# Patient Record
Sex: Female | Born: 1980 | Race: Black or African American | Hispanic: No | Marital: Single | Smoking: Current every day smoker
Health system: Southern US, Community
[De-identification: ages and names within clinical notes are randomized; demographics above are authoritative.]

## PROBLEM LIST (undated history)

## (undated) HISTORY — PX: WISDOM TOOTH EXTRACTION: SHX21

## (undated) HISTORY — PX: OTHER SURGICAL HISTORY: SHX169

---
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
## 2002-07-11 ENCOUNTER — Inpatient Hospital Stay (HOSPITAL_COMMUNITY): Admission: RE | Admit: 2002-07-11 | Discharge: 2002-07-11 | Payer: Self-pay | Admitting: Obstetrics

## 2002-07-11 ENCOUNTER — Encounter: Payer: Self-pay | Admitting: Obstetrics

## 2002-07-18 ENCOUNTER — Ambulatory Visit (HOSPITAL_COMMUNITY): Admission: RE | Admit: 2002-07-18 | Discharge: 2002-07-18 | Payer: Self-pay | Admitting: Obstetrics

## 2002-07-18 ENCOUNTER — Encounter: Payer: Self-pay | Admitting: Obstetrics

## 2002-07-25 ENCOUNTER — Ambulatory Visit (HOSPITAL_COMMUNITY): Admission: RE | Admit: 2002-07-25 | Discharge: 2002-07-25 | Payer: Self-pay | Admitting: Obstetrics

## 2002-07-25 ENCOUNTER — Encounter: Payer: Self-pay | Admitting: Obstetrics

## 2002-08-02 ENCOUNTER — Ambulatory Visit (HOSPITAL_COMMUNITY): Admission: RE | Admit: 2002-08-02 | Discharge: 2002-08-02 | Payer: Self-pay | Admitting: Obstetrics

## 2002-08-02 ENCOUNTER — Encounter: Payer: Self-pay | Admitting: Obstetrics

## 2002-08-08 ENCOUNTER — Encounter: Payer: Self-pay | Admitting: Obstetrics

## 2002-08-08 ENCOUNTER — Ambulatory Visit (HOSPITAL_COMMUNITY): Admission: RE | Admit: 2002-08-08 | Discharge: 2002-08-08 | Payer: Self-pay | Admitting: Obstetrics

## 2002-08-15 ENCOUNTER — Ambulatory Visit (HOSPITAL_COMMUNITY): Admission: RE | Admit: 2002-08-15 | Discharge: 2002-08-15 | Payer: Self-pay | Admitting: Obstetrics

## 2002-08-15 ENCOUNTER — Encounter: Payer: Self-pay | Admitting: Obstetrics

## 2002-08-22 ENCOUNTER — Encounter: Payer: Self-pay | Admitting: Obstetrics

## 2002-08-22 ENCOUNTER — Ambulatory Visit (HOSPITAL_COMMUNITY): Admission: RE | Admit: 2002-08-22 | Discharge: 2002-08-22 | Payer: Self-pay | Admitting: Obstetrics

## 2002-08-23 ENCOUNTER — Encounter: Admission: RE | Admit: 2002-08-23 | Discharge: 2002-08-23 | Payer: Self-pay | Admitting: Obstetrics

## 2002-08-29 ENCOUNTER — Encounter: Payer: Self-pay | Admitting: Obstetrics

## 2002-08-29 ENCOUNTER — Encounter: Admission: RE | Admit: 2002-08-29 | Discharge: 2002-08-29 | Payer: Self-pay | Admitting: Obstetrics

## 2002-08-29 ENCOUNTER — Ambulatory Visit (HOSPITAL_COMMUNITY): Admission: RE | Admit: 2002-08-29 | Discharge: 2002-08-29 | Payer: Self-pay | Admitting: Obstetrics

## 2002-09-05 ENCOUNTER — Encounter: Admission: RE | Admit: 2002-09-05 | Discharge: 2002-09-05 | Payer: Self-pay | Admitting: Obstetrics

## 2002-09-10 ENCOUNTER — Inpatient Hospital Stay (HOSPITAL_COMMUNITY): Admission: AD | Admit: 2002-09-10 | Discharge: 2002-09-10 | Payer: Self-pay | Admitting: Obstetrics

## 2002-09-12 ENCOUNTER — Ambulatory Visit (HOSPITAL_COMMUNITY): Admission: RE | Admit: 2002-09-12 | Discharge: 2002-09-12 | Payer: Self-pay | Admitting: Obstetrics

## 2002-09-12 ENCOUNTER — Encounter: Payer: Self-pay | Admitting: Obstetrics

## 2002-09-12 ENCOUNTER — Encounter: Admission: RE | Admit: 2002-09-12 | Discharge: 2002-09-12 | Payer: Self-pay | Admitting: Family Medicine

## 2002-09-13 ENCOUNTER — Inpatient Hospital Stay (HOSPITAL_COMMUNITY): Admission: AD | Admit: 2002-09-13 | Discharge: 2002-09-16 | Payer: Self-pay | Admitting: Obstetrics

## 2003-04-18 ENCOUNTER — Other Ambulatory Visit: Admission: RE | Admit: 2003-04-18 | Discharge: 2003-04-18 | Payer: Self-pay | Admitting: Family Medicine

## 2004-04-29 ENCOUNTER — Other Ambulatory Visit: Admission: RE | Admit: 2004-04-29 | Discharge: 2004-04-29 | Payer: Self-pay | Admitting: Family Medicine

## 2012-09-07 ENCOUNTER — Encounter (HOSPITAL_COMMUNITY): Payer: Self-pay | Admitting: *Deleted

## 2012-09-07 ENCOUNTER — Other Ambulatory Visit: Payer: Self-pay | Admitting: Obstetrics & Gynecology

## 2012-09-08 ENCOUNTER — Encounter (HOSPITAL_COMMUNITY): Payer: Self-pay | Admitting: Pharmacy Technician

## 2012-09-09 MED ORDER — DOXYCYCLINE HYCLATE 100 MG IV SOLR
100.0000 mg | Freq: Two times a day (BID) | INTRAVENOUS | Status: DC
  Filled 2012-09-09 (×4): qty 100

## 2012-09-09 MED ORDER — DOXYCYCLINE HYCLATE 100 MG IV SOLR
100.0000 mg | Freq: Once | INTRAVENOUS | Status: AC
  Administered 2012-09-10: 100 mg via INTRAVENOUS
  Filled 2012-09-09: qty 100

## 2012-09-10 ENCOUNTER — Encounter (HOSPITAL_COMMUNITY): Payer: Self-pay | Admitting: *Deleted

## 2012-09-10 ENCOUNTER — Encounter (HOSPITAL_COMMUNITY): Payer: Self-pay | Admitting: Anesthesiology

## 2012-09-10 ENCOUNTER — Ambulatory Visit (HOSPITAL_COMMUNITY): Payer: Managed Care, Other (non HMO) | Admitting: Anesthesiology

## 2012-09-10 ENCOUNTER — Ambulatory Visit (HOSPITAL_COMMUNITY)
Admission: RE | Admit: 2012-09-10 | Discharge: 2012-09-10 | Disposition: A | Discharge status: Home / Self Care | Payer: Managed Care, Other (non HMO) | Source: Ambulatory Visit | Attending: Obstetrics & Gynecology | Admitting: Obstetrics & Gynecology

## 2012-09-10 ENCOUNTER — Encounter (HOSPITAL_COMMUNITY)
Admission: RE | Disposition: A | Discharge status: Home / Self Care | Payer: Self-pay | Source: Ambulatory Visit | Attending: Obstetrics & Gynecology

## 2012-09-10 DIAGNOSIS — IMO0001 Reserved for inherently not codable concepts without codable children: Secondary | ICD-10-CM

## 2012-09-10 DIAGNOSIS — Z640 Problems related to unwanted pregnancy: Secondary | ICD-10-CM

## 2012-09-10 DIAGNOSIS — O039 Complete or unspecified spontaneous abortion without complication: Secondary | ICD-10-CM | POA: Insufficient documentation

## 2012-09-10 HISTORY — PX: DILATION AND EVACUATION: SHX1459

## 2012-09-10 LAB — CBC
HCT: 38.2 % (ref 36.0–46.0)
MCV: 84 fL (ref 78.0–100.0)
RDW: 13.9 % (ref 11.5–15.5)
WBC: 8 10*3/uL (ref 4.0–10.5)

## 2012-09-10 SURGERY — DILATION AND EVACUATION, UTERUS
Anesthesia: Monitor Anesthesia Care | Site: Vagina | Wound class: Clean Contaminated

## 2012-09-10 MED ORDER — DEXAMETHASONE SODIUM PHOSPHATE 10 MG/ML IJ SOLN
INTRAMUSCULAR | Status: AC
  Filled 2012-09-10: qty 1

## 2012-09-10 MED ORDER — ONDANSETRON HCL 4 MG/2ML IJ SOLN
INTRAMUSCULAR | Status: DC | PRN
  Administered 2012-09-10: 4 mg via INTRAVENOUS

## 2012-09-10 MED ORDER — PROPOFOL 10 MG/ML IV BOLUS
INTRAVENOUS | Status: DC | PRN
  Administered 2012-09-10 (×2): 40 mg via INTRAVENOUS
  Administered 2012-09-10: 30 mg via INTRAVENOUS
  Administered 2012-09-10: 40 mg via INTRAVENOUS
  Administered 2012-09-10 (×2): 30 mg via INTRAVENOUS

## 2012-09-10 MED ORDER — LIDOCAINE HCL (CARDIAC) 20 MG/ML IV SOLN
INTRAVENOUS | Status: AC
  Filled 2012-09-10: qty 5

## 2012-09-10 MED ORDER — CHLOROPROCAINE HCL 1 % IJ SOLN
INTRAMUSCULAR | Status: DC | PRN
  Administered 2012-09-10: 30 mL

## 2012-09-10 MED ORDER — PROPOFOL 10 MG/ML IV EMUL
INTRAVENOUS | Status: AC
  Filled 2012-09-10: qty 40

## 2012-09-10 MED ORDER — MIDAZOLAM HCL 2 MG/2ML IJ SOLN
INTRAMUSCULAR | Status: AC
  Filled 2012-09-10: qty 2

## 2012-09-10 MED ORDER — IBUPROFEN 200 MG PO TABS: 600.0000 mg | ORAL_TABLET | Freq: Three times a day (TID) | ORAL | Status: DC | PRN

## 2012-09-10 MED ORDER — ONDANSETRON HCL 4 MG/2ML IJ SOLN
INTRAMUSCULAR | Status: AC
  Filled 2012-09-10: qty 2

## 2012-09-10 MED ORDER — FENTANYL CITRATE 0.05 MG/ML IJ SOLN
INTRAMUSCULAR | Status: DC | PRN
  Administered 2012-09-10: 100 ug via INTRAVENOUS
  Administered 2012-09-10: 50 ug via INTRAVENOUS

## 2012-09-10 MED ORDER — FENTANYL CITRATE 0.05 MG/ML IJ SOLN
INTRAMUSCULAR | Status: AC
  Filled 2012-09-10: qty 5

## 2012-09-10 MED ORDER — MIDAZOLAM HCL 5 MG/5ML IJ SOLN
INTRAMUSCULAR | Status: DC | PRN
  Administered 2012-09-10: 2 mg via INTRAVENOUS

## 2012-09-10 MED ORDER — LACTATED RINGERS IV SOLN
INTRAVENOUS | Status: DC
  Administered 2012-09-10: 15:00:00 via INTRAVENOUS

## 2012-09-10 MED ORDER — KETOROLAC TROMETHAMINE 30 MG/ML IJ SOLN: 15.0000 mg | Freq: Once | INTRAMUSCULAR | Status: DC | PRN

## 2012-09-10 MED ORDER — MEPERIDINE HCL 25 MG/ML IJ SOLN: 6.2500 mg | INTRAMUSCULAR | Status: DC | PRN

## 2012-09-10 MED ORDER — KETOROLAC TROMETHAMINE 30 MG/ML IJ SOLN
INTRAMUSCULAR | Status: DC | PRN
  Administered 2012-09-10: 30 mg via INTRAVENOUS

## 2012-09-10 MED ORDER — CHLOROPROCAINE HCL 1 % IJ SOLN
INTRAMUSCULAR | Status: AC
  Filled 2012-09-10: qty 30

## 2012-09-10 MED ORDER — MIDAZOLAM HCL 2 MG/2ML IJ SOLN: 0.5000 mg | Freq: Once | INTRAMUSCULAR | Status: DC | PRN

## 2012-09-10 MED ORDER — PROMETHAZINE HCL 25 MG/ML IJ SOLN: 6.2500 mg | INTRAMUSCULAR | Status: DC | PRN

## 2012-09-10 MED ORDER — FENTANYL CITRATE 0.05 MG/ML IJ SOLN: 25.0000 ug | INTRAMUSCULAR | Status: DC | PRN

## 2012-09-10 MED ORDER — LIDOCAINE HCL (CARDIAC) 20 MG/ML IV SOLN
INTRAVENOUS | Status: DC | PRN
  Administered 2012-09-10: 40 mg via INTRAVENOUS

## 2012-09-10 SURGICAL SUPPLY — 21 items
CATH ROBINSON RED A/P 16FR (CATHETERS) ×2 IMPLANT
CLOTH BEACON ORANGE TIMEOUT ST (SAFETY) ×2 IMPLANT
DECANTER SPIKE VIAL GLASS SM (MISCELLANEOUS) ×2 IMPLANT
GLOVE BIO SURGEON STRL SZ7 (GLOVE) ×2 IMPLANT
GLOVE BIOGEL PI IND STRL 7.0 (GLOVE) ×1 IMPLANT
GLOVE BIOGEL PI INDICATOR 7.0 (GLOVE) ×2
GOWN STRL REIN XL XLG (GOWN DISPOSABLE) ×4 IMPLANT
KIT BERKELEY 1ST TRIMESTER 3/8 (MISCELLANEOUS) ×2 IMPLANT
NDL SPNL 22GX3.5 QUINCKE BK (NEEDLE) ×1 IMPLANT
NEEDLE SPNL 22GX3.5 QUINCKE BK (NEEDLE) ×2 IMPLANT
NS IRRIG 1000ML POUR BTL (IV SOLUTION) ×2 IMPLANT
PACK VAGINAL MINOR WOMEN LF (CUSTOM PROCEDURE TRAY) ×2 IMPLANT
PAD OB MATERNITY 4.3X12.25 (PERSONAL CARE ITEMS) ×2 IMPLANT
PAD PREP 24X48 CUFFED NSTRL (MISCELLANEOUS) ×2 IMPLANT
SET BERKELEY SUCTION TUBING (SUCTIONS) ×2 IMPLANT
SYR CONTROL 10ML LL (SYRINGE) ×2 IMPLANT
TOWEL OR 17X24 6PK STRL BLUE (TOWEL DISPOSABLE) ×4 IMPLANT
VACURETTE 10 RIGID CVD (CANNULA) IMPLANT
VACURETTE 7MM CVD STRL WRAP (CANNULA) ×1 IMPLANT
VACURETTE 8 RIGID CVD (CANNULA) IMPLANT
VACURETTE 9 RIGID CVD (CANNULA) IMPLANT

## 2012-09-10 NOTE — Anesthesia Preprocedure Evaluation (Signed)
Anesthesia Evaluation  Patient identified by MRN, date of birth, ID band Patient awake    Reviewed: Allergy & Precautions, H&P , Patient's Chart, lab work & pertinent test results, reviewed documented beta blocker date and time   History of Anesthesia Complications Negative for: history of anesthetic complications  Airway Mallampati: II TM Distance: >3 FB Neck ROM: full    Dental no notable dental hx.    Pulmonary neg pulmonary ROS,  breath sounds clear to auscultation  Pulmonary exam normal       Cardiovascular Exercise Tolerance: Good negative cardio ROS  Rhythm:regular Rate:Normal     Neuro/Psych negative neurological ROS  negative psych ROS   GI/Hepatic negative GI ROS, Neg liver ROS,   Endo/Other  negative endocrine ROS  Renal/GU negative Renal ROS     Musculoskeletal   Abdominal   Peds  Hematology negative hematology ROS (+)   Anesthesia Other Findings   Reproductive/Obstetrics negative OB ROS                           Anesthesia Physical Anesthesia Plan  ASA: II  Anesthesia Plan: MAC   Post-op Pain Management:    Induction:   Airway Management Planned:   Additional Equipment:   Intra-op Plan:   Post-operative Plan:   Informed Consent: I have reviewed the patients History and Physical, chart, labs and discussed the procedure including the risks, benefits and alternatives for the proposed anesthesia with the patient or authorized representative who has indicated his/her understanding and acceptance.   Dental Advisory Given  Plan Discussed with: CRNA, Surgeon and Anesthesiologist  Anesthesia Plan Comments:         Anesthesia Quick Evaluation

## 2012-09-10 NOTE — Anesthesia Postprocedure Evaluation (Signed)
Anesthesia Post Note  Patient: Brianna Schwartz  Procedure(s) Performed: Procedure(s) (LRB): DILATATION AND EVACUATION (N/A)  Anesthesia type: MAC  Patient location: PACU  Post pain: Pain level controlled  Post assessment: Post-op Vital signs reviewed  Last Vitals:  Filed Vitals:   09/10/12 1715  BP: 114/73  Pulse: 73  Temp:   Resp: 15    Post vital signs: Reviewed  Level of consciousness: sedated  Complications: No apparent anesthesia complications

## 2012-09-10 NOTE — Transfer of Care (Signed)
Immediate Anesthesia Transfer of Care Note  Patient: Brianna Schwartz  Procedure(s) Performed: Procedure(s): DILATATION AND EVACUATION (N/A)  Patient Location: PACU  Anesthesia Type:MAC  Level of Consciousness: awake, alert , oriented and patient cooperative  Airway & Oxygen Therapy: Patient Spontanous Breathing  Post-op Assessment: Report given to PACU RN and Post -op Vital signs reviewed and stable  Post vital signs: Reviewed and stable  Complications: No apparent anesthesia complications

## 2012-09-10 NOTE — H&P (Signed)
Brianna Schwartz is an 32 y.o. female G2P1002, with unplanned and unwanted pregnancy at 7.6 wks, here for pregnancy termination by choice and is aware of her options to continuing pregnancy, BUFA and pregnancy termination after adequate counseling 4 days back.  No bleeding or pain. Office sono c/w IUP at 7.6 wks.  Menses regular Prior pregnancy - Twins c/sectiobn x 1 BC none, desires sterilization Remote abn Pap hx.  NO breast complaints.   Patient's last menstrual period was 07/20/2012.   History reviewed. No pertinent past medical history.  Past Surgical History  Procedure Laterality Date  . Removed boil      buttocks  . Cesarean section      x 1  . Wisdom tooth extraction     History reviewed. No pertinent family history.  Social History:  reports that she has been smoking Cigarettes.  She has a 7.5 pack-year smoking history. She has never used smokeless tobacco. She reports that  drinks alcohol. She reports that she does not use illicit drugs.  Allergies: No Known Allergies  No prescriptions prior to admission   Review of Systems  Constitutional: Negative for fever.  Respiratory: Negative for cough.   Cardiovascular: Negative for chest pain.  Gastrointestinal: Negative for heartburn and abdominal pain.  Genitourinary: Negative for dysuria.  Neurological: Negative for headaches.    Physical Exam Blood pressure 127/76, pulse 91, temperature 98.6 F (37 C), temperature source Oral, resp. rate 16, height 5' 8.5" (1.74 m), weight 215 lb (97.523 kg), last menstrual period 07/20/2012, SpO2 100.00%.  A&O x 3, no acute distress. Pleasant HEENT neg, no thyromegaly Lungs CTA bilat CV RRR, S1S2 normal Abdo soft, non tender, non acute Extr no edema/ tenderness Pelvic Uterus 7-8 wks. No adnexal masses   Results for orders placed during the hospital encounter of 09/10/12 (from the past 24 hour(s))  ABO/RH     Status: None   Collection Time    09/10/12  3:00 PM      Result  Value Range   ABO/RH(D) AB POS    CBC     Status: None   Collection Time    09/10/12  3:00 PM      Result Value Range   WBC 8.0  4.0 - 10.5 K/uL   RBC 4.55  3.87 - 5.11 MIL/uL   Hemoglobin 13.1  12.0 - 15.0 g/dL   HCT 21.3  08.6 - 57.8 %   MCV 84.0  78.0 - 100.0 fL   MCH 28.8  26.0 - 34.0 pg   MCHC 34.3  30.0 - 36.0 g/dL   RDW 46.9  62.9 - 52.8 %   Platelets 250  150 - 400 K/uL    No results found.  Assessment/Plan: 32 yo, with 7.6 wks IUP, undesired pregnancy, here for pregnancy termination.  AB(+)  Risks/complications of surgery reviewed incl infection, bleeding, damage to internal organs including bladder, bowels, ureters, blood vessels, other risks from anesthesia, VTE and delayed complications of any surgery, complications in future surgery reviewed.  Emanual Lamountain R 09/10/2012, 4:03 PM

## 2012-09-10 NOTE — Op Note (Signed)
Preoperative diagnosis: Unwanted, Unplanned pregnancy at 7.6 wks.  Postop diagnosis: as above.  Procedure: Suction D&E Anesthesia General MAC and 30 cc Paracervical block  Surgeon: Shea Evans, MD Assistant: none  IV fluids : LR 600 cc Estimated blood loss: 30 cc Urine output:  straight catheter preop Complications none Condition stable Disposition PACU  Specimen: products of conception sent to pathology.  Procedure  Indication: Decision was made to proceed with suction D&E. Risks/ complications of surgery including infection, bleeding, damage to internal organs including uterine perforation, Asherman syndrome were reviewed. She voiced understanding and gave informed written consent.  Patient was brought to the operating room with IV running. She received preop 100 mg Doxycycline. She underwent general endotracheal anesthesia without complications. She was given dorsolithotomy position. Parts were prepped and draped in standard fashion.  Bimanual exam revealed uterus to be anteverted and 8 week size.  Speculum was placed and cervix was grasped with single-tooth tenaculum.  Cervical os was pinpoint and dilated to 21 [place] dilator. A # 7 suction curette was introduced in the uterine cavity and suction evacuation of products of conception was done in multiple step wise fashion. Gentle sharp curettage was performed. Suction cannula was reintroduced.  The bleeding was controlled well and uterus was firm on exam.  At this point the procedure was complete.  All counts are correct x2. Specimen products of conception. No complications. Patient was made supine dorsal anesthesia and brought to the recovery room in stable condition.  Patient will be discharged home today. Dc follow up in 2 weeks in office. Warning signs of infection and excessive bleeding reviewed.  V.Zavia Pullen, MD.

## 2012-09-11 ENCOUNTER — Encounter (HOSPITAL_COMMUNITY): Payer: Self-pay | Admitting: Obstetrics & Gynecology

## 2013-12-30 ENCOUNTER — Encounter (HOSPITAL_COMMUNITY): Payer: Self-pay | Admitting: Obstetrics & Gynecology

## 2015-07-24 DIAGNOSIS — R5383 Other fatigue: Secondary | ICD-10-CM | POA: Diagnosis not present

## 2015-07-24 DIAGNOSIS — R635 Abnormal weight gain: Secondary | ICD-10-CM | POA: Diagnosis not present

## 2015-10-30 DIAGNOSIS — Z6835 Body mass index (BMI) 35.0-35.9, adult: Secondary | ICD-10-CM | POA: Diagnosis not present

## 2015-10-30 DIAGNOSIS — R5383 Other fatigue: Secondary | ICD-10-CM | POA: Diagnosis not present

## 2015-11-26 DIAGNOSIS — N760 Acute vaginitis: Secondary | ICD-10-CM | POA: Diagnosis not present

## 2015-12-09 DIAGNOSIS — R5383 Other fatigue: Secondary | ICD-10-CM | POA: Diagnosis not present

## 2015-12-09 DIAGNOSIS — Z6835 Body mass index (BMI) 35.0-35.9, adult: Secondary | ICD-10-CM | POA: Diagnosis not present

## 2016-01-05 DIAGNOSIS — D649 Anemia, unspecified: Secondary | ICD-10-CM | POA: Diagnosis not present

## 2016-01-05 DIAGNOSIS — R7301 Impaired fasting glucose: Secondary | ICD-10-CM | POA: Diagnosis not present

## 2016-01-05 DIAGNOSIS — J301 Allergic rhinitis due to pollen: Secondary | ICD-10-CM | POA: Diagnosis not present

## 2016-01-05 DIAGNOSIS — E878 Other disorders of electrolyte and fluid balance, not elsewhere classified: Secondary | ICD-10-CM | POA: Diagnosis not present

## 2016-01-05 DIAGNOSIS — J019 Acute sinusitis, unspecified: Secondary | ICD-10-CM | POA: Diagnosis not present

## 2016-01-05 DIAGNOSIS — E569 Vitamin deficiency, unspecified: Secondary | ICD-10-CM | POA: Diagnosis not present

## 2016-01-05 DIAGNOSIS — N925 Other specified irregular menstruation: Secondary | ICD-10-CM | POA: Diagnosis not present

## 2016-01-05 DIAGNOSIS — J069 Acute upper respiratory infection, unspecified: Secondary | ICD-10-CM | POA: Diagnosis not present

## 2016-01-05 DIAGNOSIS — Z6835 Body mass index (BMI) 35.0-35.9, adult: Secondary | ICD-10-CM | POA: Diagnosis not present

## 2016-01-09 DIAGNOSIS — H81399 Other peripheral vertigo, unspecified ear: Secondary | ICD-10-CM | POA: Diagnosis not present

## 2016-02-04 DIAGNOSIS — Z0000 Encounter for general adult medical examination without abnormal findings: Secondary | ICD-10-CM | POA: Diagnosis not present

## 2016-02-12 DIAGNOSIS — E559 Vitamin D deficiency, unspecified: Secondary | ICD-10-CM | POA: Diagnosis not present

## 2016-02-12 DIAGNOSIS — M545 Low back pain: Secondary | ICD-10-CM | POA: Diagnosis not present

## 2016-02-12 DIAGNOSIS — Z0000 Encounter for general adult medical examination without abnormal findings: Secondary | ICD-10-CM | POA: Diagnosis not present

## 2016-02-12 DIAGNOSIS — E663 Overweight: Secondary | ICD-10-CM | POA: Diagnosis not present

## 2016-02-12 DIAGNOSIS — H81319 Aural vertigo, unspecified ear: Secondary | ICD-10-CM | POA: Diagnosis not present

## 2016-02-12 DIAGNOSIS — J302 Other seasonal allergic rhinitis: Secondary | ICD-10-CM | POA: Diagnosis not present

## 2016-02-12 DIAGNOSIS — R5383 Other fatigue: Secondary | ICD-10-CM | POA: Diagnosis not present

## 2016-06-16 DIAGNOSIS — R5382 Chronic fatigue, unspecified: Secondary | ICD-10-CM | POA: Diagnosis not present

## 2016-06-16 DIAGNOSIS — J301 Allergic rhinitis due to pollen: Secondary | ICD-10-CM | POA: Diagnosis not present

## 2016-06-16 DIAGNOSIS — E663 Overweight: Secondary | ICD-10-CM | POA: Diagnosis not present

## 2016-06-16 DIAGNOSIS — E559 Vitamin D deficiency, unspecified: Secondary | ICD-10-CM | POA: Diagnosis not present

## 2016-07-18 DIAGNOSIS — D518 Other vitamin B12 deficiency anemias: Secondary | ICD-10-CM | POA: Diagnosis not present

## 2016-07-18 DIAGNOSIS — J301 Allergic rhinitis due to pollen: Secondary | ICD-10-CM | POA: Diagnosis not present

## 2016-07-18 DIAGNOSIS — E559 Vitamin D deficiency, unspecified: Secondary | ICD-10-CM | POA: Diagnosis not present

## 2016-07-18 DIAGNOSIS — R5382 Chronic fatigue, unspecified: Secondary | ICD-10-CM | POA: Diagnosis not present

## 2016-08-19 DIAGNOSIS — R5383 Other fatigue: Secondary | ICD-10-CM | POA: Diagnosis not present

## 2016-08-19 DIAGNOSIS — E559 Vitamin D deficiency, unspecified: Secondary | ICD-10-CM | POA: Diagnosis not present

## 2016-08-19 DIAGNOSIS — D518 Other vitamin B12 deficiency anemias: Secondary | ICD-10-CM | POA: Diagnosis not present

## 2016-08-19 DIAGNOSIS — E663 Overweight: Secondary | ICD-10-CM | POA: Diagnosis not present

## 2016-09-20 DIAGNOSIS — R5383 Other fatigue: Secondary | ICD-10-CM | POA: Diagnosis not present

## 2016-09-20 DIAGNOSIS — E663 Overweight: Secondary | ICD-10-CM | POA: Diagnosis not present

## 2016-09-20 DIAGNOSIS — E559 Vitamin D deficiency, unspecified: Secondary | ICD-10-CM | POA: Diagnosis not present

## 2016-09-20 DIAGNOSIS — D518 Other vitamin B12 deficiency anemias: Secondary | ICD-10-CM | POA: Diagnosis not present

## 2017-02-14 DIAGNOSIS — Z0001 Encounter for general adult medical examination with abnormal findings: Secondary | ICD-10-CM | POA: Diagnosis not present

## 2017-02-14 DIAGNOSIS — Z124 Encounter for screening for malignant neoplasm of cervix: Secondary | ICD-10-CM | POA: Diagnosis not present

## 2017-02-14 DIAGNOSIS — Z1331 Encounter for screening for depression: Secondary | ICD-10-CM | POA: Diagnosis not present

## 2017-02-14 DIAGNOSIS — E669 Obesity, unspecified: Secondary | ICD-10-CM | POA: Diagnosis not present

## 2017-04-19 DIAGNOSIS — M722 Plantar fascial fibromatosis: Secondary | ICD-10-CM | POA: Diagnosis not present

## 2017-04-19 DIAGNOSIS — M71572 Other bursitis, not elsewhere classified, left ankle and foot: Secondary | ICD-10-CM | POA: Diagnosis not present

## 2017-04-19 DIAGNOSIS — M7672 Peroneal tendinitis, left leg: Secondary | ICD-10-CM | POA: Diagnosis not present

## 2017-04-19 DIAGNOSIS — M7731 Calcaneal spur, right foot: Secondary | ICD-10-CM | POA: Diagnosis not present

## 2017-04-19 DIAGNOSIS — M76822 Posterior tibial tendinitis, left leg: Secondary | ICD-10-CM | POA: Diagnosis not present

## 2017-04-19 DIAGNOSIS — M7732 Calcaneal spur, left foot: Secondary | ICD-10-CM | POA: Diagnosis not present

## 2017-04-28 DIAGNOSIS — M71572 Other bursitis, not elsewhere classified, left ankle and foot: Secondary | ICD-10-CM | POA: Diagnosis not present

## 2017-04-28 DIAGNOSIS — M722 Plantar fascial fibromatosis: Secondary | ICD-10-CM | POA: Diagnosis not present

## 2017-05-08 DIAGNOSIS — M722 Plantar fascial fibromatosis: Secondary | ICD-10-CM | POA: Diagnosis not present

## 2017-05-08 DIAGNOSIS — B351 Tinea unguium: Secondary | ICD-10-CM | POA: Diagnosis not present

## 2017-05-08 DIAGNOSIS — M71572 Other bursitis, not elsewhere classified, left ankle and foot: Secondary | ICD-10-CM | POA: Diagnosis not present

## 2017-05-16 DIAGNOSIS — R071 Chest pain on breathing: Secondary | ICD-10-CM | POA: Diagnosis not present

## 2017-05-16 DIAGNOSIS — Z6835 Body mass index (BMI) 35.0-35.9, adult: Secondary | ICD-10-CM | POA: Diagnosis not present

## 2017-05-16 DIAGNOSIS — E669 Obesity, unspecified: Secondary | ICD-10-CM | POA: Diagnosis not present

## 2017-05-30 DIAGNOSIS — J309 Allergic rhinitis, unspecified: Secondary | ICD-10-CM | POA: Diagnosis not present

## 2017-06-28 DIAGNOSIS — N946 Dysmenorrhea, unspecified: Secondary | ICD-10-CM | POA: Diagnosis not present

## 2017-06-28 DIAGNOSIS — Z6836 Body mass index (BMI) 36.0-36.9, adult: Secondary | ICD-10-CM | POA: Diagnosis not present

## 2017-08-30 DIAGNOSIS — E559 Vitamin D deficiency, unspecified: Secondary | ICD-10-CM | POA: Diagnosis not present

## 2017-08-30 DIAGNOSIS — M84375A Stress fracture, left foot, initial encounter for fracture: Secondary | ICD-10-CM | POA: Diagnosis not present

## 2017-08-30 DIAGNOSIS — M7672 Peroneal tendinitis, left leg: Secondary | ICD-10-CM | POA: Diagnosis not present

## 2017-09-06 DIAGNOSIS — E559 Vitamin D deficiency, unspecified: Secondary | ICD-10-CM | POA: Diagnosis not present

## 2017-09-06 DIAGNOSIS — M84375D Stress fracture, left foot, subsequent encounter for fracture with routine healing: Secondary | ICD-10-CM | POA: Diagnosis not present

## 2017-09-20 DIAGNOSIS — M84375D Stress fracture, left foot, subsequent encounter for fracture with routine healing: Secondary | ICD-10-CM | POA: Diagnosis not present

## 2017-10-09 DIAGNOSIS — M84375D Stress fracture, left foot, subsequent encounter for fracture with routine healing: Secondary | ICD-10-CM | POA: Diagnosis not present

## 2017-11-13 DIAGNOSIS — E559 Vitamin D deficiency, unspecified: Secondary | ICD-10-CM | POA: Diagnosis not present

## 2017-11-13 DIAGNOSIS — Z6838 Body mass index (BMI) 38.0-38.9, adult: Secondary | ICD-10-CM | POA: Diagnosis not present

## 2017-11-13 DIAGNOSIS — R030 Elevated blood-pressure reading, without diagnosis of hypertension: Secondary | ICD-10-CM | POA: Diagnosis not present

## 2018-02-13 DIAGNOSIS — Z111 Encounter for screening for respiratory tuberculosis: Secondary | ICD-10-CM | POA: Diagnosis not present

## 2018-02-19 DIAGNOSIS — Z1331 Encounter for screening for depression: Secondary | ICD-10-CM | POA: Diagnosis not present

## 2018-02-19 DIAGNOSIS — Z6837 Body mass index (BMI) 37.0-37.9, adult: Secondary | ICD-10-CM | POA: Diagnosis not present

## 2018-02-19 DIAGNOSIS — Z0001 Encounter for general adult medical examination with abnormal findings: Secondary | ICD-10-CM | POA: Diagnosis not present

## 2018-02-19 DIAGNOSIS — E559 Vitamin D deficiency, unspecified: Secondary | ICD-10-CM | POA: Diagnosis not present

## 2018-06-03 DIAGNOSIS — M5410 Radiculopathy, site unspecified: Secondary | ICD-10-CM | POA: Diagnosis not present

## 2018-06-03 DIAGNOSIS — M542 Cervicalgia: Secondary | ICD-10-CM | POA: Diagnosis not present

## 2018-06-21 DIAGNOSIS — M791 Myalgia, unspecified site: Secondary | ICD-10-CM | POA: Diagnosis not present

## 2018-06-28 DIAGNOSIS — M79602 Pain in left arm: Secondary | ICD-10-CM | POA: Diagnosis not present

## 2018-06-28 DIAGNOSIS — R000 Tachycardia, unspecified: Secondary | ICD-10-CM | POA: Diagnosis not present

## 2018-06-28 DIAGNOSIS — M62838 Other muscle spasm: Secondary | ICD-10-CM | POA: Diagnosis not present

## 2018-06-28 DIAGNOSIS — M25512 Pain in left shoulder: Secondary | ICD-10-CM | POA: Diagnosis not present

## 2018-06-28 DIAGNOSIS — Z6837 Body mass index (BMI) 37.0-37.9, adult: Secondary | ICD-10-CM | POA: Diagnosis not present

## 2018-06-28 DIAGNOSIS — F17210 Nicotine dependence, cigarettes, uncomplicated: Secondary | ICD-10-CM | POA: Diagnosis not present

## 2018-06-28 DIAGNOSIS — M50322 Other cervical disc degeneration at C5-C6 level: Secondary | ICD-10-CM | POA: Diagnosis not present

## 2018-06-28 DIAGNOSIS — M542 Cervicalgia: Secondary | ICD-10-CM | POA: Diagnosis not present

## 2018-06-29 DIAGNOSIS — M5412 Radiculopathy, cervical region: Secondary | ICD-10-CM | POA: Diagnosis not present

## 2018-06-29 DIAGNOSIS — M542 Cervicalgia: Secondary | ICD-10-CM | POA: Diagnosis not present

## 2018-07-06 DIAGNOSIS — M542 Cervicalgia: Secondary | ICD-10-CM | POA: Diagnosis not present

## 2018-07-06 DIAGNOSIS — M25519 Pain in unspecified shoulder: Secondary | ICD-10-CM | POA: Diagnosis not present

## 2018-07-10 DIAGNOSIS — M542 Cervicalgia: Secondary | ICD-10-CM | POA: Diagnosis not present

## 2018-07-10 DIAGNOSIS — M5412 Radiculopathy, cervical region: Secondary | ICD-10-CM | POA: Diagnosis not present

## 2018-07-10 DIAGNOSIS — Z6837 Body mass index (BMI) 37.0-37.9, adult: Secondary | ICD-10-CM | POA: Diagnosis not present

## 2018-07-10 DIAGNOSIS — Z029 Encounter for administrative examinations, unspecified: Secondary | ICD-10-CM | POA: Diagnosis not present

## 2018-07-11 DIAGNOSIS — M25519 Pain in unspecified shoulder: Secondary | ICD-10-CM | POA: Diagnosis not present

## 2018-07-11 DIAGNOSIS — M542 Cervicalgia: Secondary | ICD-10-CM | POA: Diagnosis not present

## 2018-07-18 DIAGNOSIS — M25519 Pain in unspecified shoulder: Secondary | ICD-10-CM | POA: Diagnosis not present

## 2018-07-18 DIAGNOSIS — M542 Cervicalgia: Secondary | ICD-10-CM | POA: Diagnosis not present

## 2018-07-24 DIAGNOSIS — M5412 Radiculopathy, cervical region: Secondary | ICD-10-CM | POA: Diagnosis not present

## 2018-07-24 DIAGNOSIS — M542 Cervicalgia: Secondary | ICD-10-CM | POA: Diagnosis not present

## 2018-07-27 DIAGNOSIS — M542 Cervicalgia: Secondary | ICD-10-CM | POA: Diagnosis not present

## 2018-07-27 DIAGNOSIS — M25519 Pain in unspecified shoulder: Secondary | ICD-10-CM | POA: Diagnosis not present

## 2018-07-30 DIAGNOSIS — M25519 Pain in unspecified shoulder: Secondary | ICD-10-CM | POA: Diagnosis not present

## 2018-07-30 DIAGNOSIS — M542 Cervicalgia: Secondary | ICD-10-CM | POA: Diagnosis not present

## 2018-08-01 DIAGNOSIS — M542 Cervicalgia: Secondary | ICD-10-CM | POA: Diagnosis not present

## 2018-08-07 DIAGNOSIS — M542 Cervicalgia: Secondary | ICD-10-CM | POA: Diagnosis not present

## 2018-08-08 DIAGNOSIS — M542 Cervicalgia: Secondary | ICD-10-CM | POA: Diagnosis not present

## 2018-08-08 DIAGNOSIS — M25519 Pain in unspecified shoulder: Secondary | ICD-10-CM | POA: Diagnosis not present

## 2018-08-15 DIAGNOSIS — M25519 Pain in unspecified shoulder: Secondary | ICD-10-CM | POA: Diagnosis not present

## 2018-08-15 DIAGNOSIS — M542 Cervicalgia: Secondary | ICD-10-CM | POA: Diagnosis not present

## 2018-08-29 DIAGNOSIS — M542 Cervicalgia: Secondary | ICD-10-CM | POA: Diagnosis not present

## 2018-08-29 DIAGNOSIS — M25519 Pain in unspecified shoulder: Secondary | ICD-10-CM | POA: Diagnosis not present

## 2018-09-03 DIAGNOSIS — M25519 Pain in unspecified shoulder: Secondary | ICD-10-CM | POA: Diagnosis not present

## 2018-09-03 DIAGNOSIS — M542 Cervicalgia: Secondary | ICD-10-CM | POA: Diagnosis not present

## 2018-09-13 DIAGNOSIS — M542 Cervicalgia: Secondary | ICD-10-CM | POA: Diagnosis not present

## 2018-09-13 DIAGNOSIS — M25519 Pain in unspecified shoulder: Secondary | ICD-10-CM | POA: Diagnosis not present

## 2018-09-19 DIAGNOSIS — M25519 Pain in unspecified shoulder: Secondary | ICD-10-CM | POA: Diagnosis not present

## 2018-09-19 DIAGNOSIS — M542 Cervicalgia: Secondary | ICD-10-CM | POA: Diagnosis not present

## 2018-10-05 DIAGNOSIS — M5030 Other cervical disc degeneration, unspecified cervical region: Secondary | ICD-10-CM | POA: Diagnosis not present

## 2018-10-31 DIAGNOSIS — M542 Cervicalgia: Secondary | ICD-10-CM | POA: Diagnosis not present

## 2018-11-21 DIAGNOSIS — M542 Cervicalgia: Secondary | ICD-10-CM | POA: Diagnosis not present

## 2018-11-29 DIAGNOSIS — M5412 Radiculopathy, cervical region: Secondary | ICD-10-CM | POA: Diagnosis not present

## 2018-12-07 DIAGNOSIS — M542 Cervicalgia: Secondary | ICD-10-CM | POA: Diagnosis not present

## 2018-12-21 ENCOUNTER — Other Ambulatory Visit: Payer: Self-pay

## 2018-12-21 ENCOUNTER — Emergency Department: Payer: BC Managed Care – PPO

## 2018-12-21 ENCOUNTER — Encounter: Payer: Self-pay | Admitting: Emergency Medicine

## 2018-12-21 ENCOUNTER — Emergency Department
Admission: EM | Admit: 2018-12-21 | Discharge: 2018-12-21 | Disposition: A | Discharge status: Home / Self Care | Payer: BC Managed Care – PPO | Attending: Emergency Medicine | Admitting: Emergency Medicine

## 2018-12-21 DIAGNOSIS — R202 Paresthesia of skin: Secondary | ICD-10-CM | POA: Diagnosis not present

## 2018-12-21 DIAGNOSIS — F17210 Nicotine dependence, cigarettes, uncomplicated: Secondary | ICD-10-CM | POA: Diagnosis not present

## 2018-12-21 DIAGNOSIS — Y92410 Unspecified street and highway as the place of occurrence of the external cause: Secondary | ICD-10-CM | POA: Insufficient documentation

## 2018-12-21 DIAGNOSIS — Y9389 Activity, other specified: Secondary | ICD-10-CM | POA: Diagnosis not present

## 2018-12-21 DIAGNOSIS — S161XXA Strain of muscle, fascia and tendon at neck level, initial encounter: Secondary | ICD-10-CM | POA: Insufficient documentation

## 2018-12-21 DIAGNOSIS — M25512 Pain in left shoulder: Secondary | ICD-10-CM | POA: Insufficient documentation

## 2018-12-21 DIAGNOSIS — R52 Pain, unspecified: Secondary | ICD-10-CM | POA: Diagnosis not present

## 2018-12-21 DIAGNOSIS — M7918 Myalgia, other site: Secondary | ICD-10-CM

## 2018-12-21 DIAGNOSIS — S199XXA Unspecified injury of neck, initial encounter: Secondary | ICD-10-CM | POA: Diagnosis not present

## 2018-12-21 DIAGNOSIS — Y999 Unspecified external cause status: Secondary | ICD-10-CM | POA: Diagnosis not present

## 2018-12-21 DIAGNOSIS — M542 Cervicalgia: Secondary | ICD-10-CM | POA: Diagnosis not present

## 2018-12-21 MED ORDER — CYCLOBENZAPRINE HCL 10 MG PO TABS: 10.0000 mg | ORAL_TABLET | Freq: Three times a day (TID) | ORAL | 0 refills | Status: AC | PRN

## 2018-12-21 MED ORDER — TRAMADOL HCL 50 MG PO TABS
50.0000 mg | ORAL_TABLET | Freq: Once | ORAL | Status: AC
  Administered 2018-12-21: 50 mg via ORAL
  Filled 2018-12-21: qty 1

## 2018-12-21 MED ORDER — IBUPROFEN 600 MG PO TABS
600.0000 mg | ORAL_TABLET | Freq: Once | ORAL | Status: AC
  Administered 2018-12-21: 600 mg via ORAL
  Filled 2018-12-21: qty 1

## 2018-12-21 MED ORDER — IBUPROFEN 600 MG PO TABS: 600.0000 mg | ORAL_TABLET | Freq: Three times a day (TID) | ORAL | 0 refills | Status: AC | PRN

## 2018-12-21 MED ORDER — CYCLOBENZAPRINE HCL 10 MG PO TABS
10.0000 mg | ORAL_TABLET | Freq: Once | ORAL | Status: AC
  Administered 2018-12-21: 10 mg via ORAL
  Filled 2018-12-21: qty 1

## 2018-12-21 MED ORDER — TRAMADOL HCL 50 MG PO TABS: 50.0000 mg | ORAL_TABLET | Freq: Four times a day (QID) | ORAL | 0 refills | Status: AC | PRN
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
## 2018-12-21 NOTE — Discharge Instructions (Addendum)
Follow discharge care instruction take medication as directed.  Be advised medication may cause drowsiness. 
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
## 2019-01-01 DIAGNOSIS — S39012A Strain of muscle, fascia and tendon of lower back, initial encounter: Secondary | ICD-10-CM | POA: Diagnosis not present

## 2019-01-01 DIAGNOSIS — M542 Cervicalgia: Secondary | ICD-10-CM | POA: Diagnosis not present

## 2019-01-01 DIAGNOSIS — S134XXA Sprain of ligaments of cervical spine, initial encounter: Secondary | ICD-10-CM | POA: Diagnosis not present

## 2019-01-01 DIAGNOSIS — M79605 Pain in left leg: Secondary | ICD-10-CM | POA: Diagnosis not present

## 2019-01-11 DIAGNOSIS — M542 Cervicalgia: Secondary | ICD-10-CM | POA: Diagnosis not present

## 2019-01-29 DIAGNOSIS — M542 Cervicalgia: Secondary | ICD-10-CM | POA: Diagnosis not present

## 2019-02-05 DIAGNOSIS — M542 Cervicalgia: Secondary | ICD-10-CM | POA: Diagnosis not present

## 2019-02-07 DIAGNOSIS — M79605 Pain in left leg: Secondary | ICD-10-CM | POA: Diagnosis not present

## 2019-02-07 DIAGNOSIS — M25562 Pain in left knee: Secondary | ICD-10-CM | POA: Diagnosis not present

## 2019-02-19 DIAGNOSIS — M25562 Pain in left knee: Secondary | ICD-10-CM | POA: Diagnosis not present

## 2019-02-19 DIAGNOSIS — E049 Nontoxic goiter, unspecified: Secondary | ICD-10-CM | POA: Diagnosis not present

## 2019-02-19 DIAGNOSIS — E669 Obesity, unspecified: Secondary | ICD-10-CM | POA: Diagnosis not present

## 2019-02-19 DIAGNOSIS — Z23 Encounter for immunization: Secondary | ICD-10-CM | POA: Diagnosis not present

## 2019-02-19 DIAGNOSIS — Z0000 Encounter for general adult medical examination without abnormal findings: Secondary | ICD-10-CM | POA: Diagnosis not present

## 2019-02-19 DIAGNOSIS — E559 Vitamin D deficiency, unspecified: Secondary | ICD-10-CM | POA: Diagnosis not present

## 2019-02-19 DIAGNOSIS — Z13220 Encounter for screening for lipoid disorders: Secondary | ICD-10-CM | POA: Diagnosis not present

## 2019-03-18 DIAGNOSIS — M25562 Pain in left knee: Secondary | ICD-10-CM | POA: Diagnosis not present

## 2019-03-18 DIAGNOSIS — S83282A Other tear of lateral meniscus, current injury, left knee, initial encounter: Secondary | ICD-10-CM | POA: Diagnosis not present

## 2019-04-11 DIAGNOSIS — R0981 Nasal congestion: Secondary | ICD-10-CM | POA: Diagnosis not present

## 2019-04-11 DIAGNOSIS — Z20828 Contact with and (suspected) exposure to other viral communicable diseases: Secondary | ICD-10-CM | POA: Diagnosis not present

## 2019-04-29 DIAGNOSIS — S83282A Other tear of lateral meniscus, current injury, left knee, initial encounter: Secondary | ICD-10-CM | POA: Diagnosis not present

## 2019-04-29 DIAGNOSIS — X58XXXA Exposure to other specified factors, initial encounter: Secondary | ICD-10-CM | POA: Diagnosis not present

## 2019-04-29 DIAGNOSIS — M94262 Chondromalacia, left knee: Secondary | ICD-10-CM | POA: Diagnosis not present

## 2019-05-06 DIAGNOSIS — M25562 Pain in left knee: Secondary | ICD-10-CM | POA: Diagnosis not present

## 2019-05-09 DIAGNOSIS — M25562 Pain in left knee: Secondary | ICD-10-CM | POA: Diagnosis not present

## 2019-05-13 DIAGNOSIS — M25562 Pain in left knee: Secondary | ICD-10-CM | POA: Diagnosis not present

## 2019-05-17 DIAGNOSIS — M25562 Pain in left knee: Secondary | ICD-10-CM | POA: Diagnosis not present

## 2019-05-20 DIAGNOSIS — M25562 Pain in left knee: Secondary | ICD-10-CM | POA: Diagnosis not present

## 2019-05-24 DIAGNOSIS — M25562 Pain in left knee: Secondary | ICD-10-CM | POA: Diagnosis not present

## 2019-05-28 DIAGNOSIS — Z111 Encounter for screening for respiratory tuberculosis: Secondary | ICD-10-CM | POA: Diagnosis not present

## 2019-05-28 DIAGNOSIS — M25562 Pain in left knee: Secondary | ICD-10-CM | POA: Diagnosis not present

## 2019-05-31 DIAGNOSIS — M25562 Pain in left knee: Secondary | ICD-10-CM | POA: Diagnosis not present

## 2019-06-14 DIAGNOSIS — M25562 Pain in left knee: Secondary | ICD-10-CM | POA: Diagnosis not present

## 2019-06-17 DIAGNOSIS — M25562 Pain in left knee: Secondary | ICD-10-CM | POA: Diagnosis not present

## 2019-07-01 DIAGNOSIS — M25562 Pain in left knee: Secondary | ICD-10-CM | POA: Diagnosis not present

## 2019-10-08 DIAGNOSIS — Z6836 Body mass index (BMI) 36.0-36.9, adult: Secondary | ICD-10-CM | POA: Diagnosis not present

## 2019-10-08 DIAGNOSIS — R42 Dizziness and giddiness: Secondary | ICD-10-CM | POA: Diagnosis not present

## 2019-10-08 DIAGNOSIS — Z1331 Encounter for screening for depression: Secondary | ICD-10-CM | POA: Diagnosis not present

## 2020-02-15 DIAGNOSIS — Z20822 Contact with and (suspected) exposure to covid-19: Secondary | ICD-10-CM | POA: Diagnosis not present

## 2020-04-03 DIAGNOSIS — E559 Vitamin D deficiency, unspecified: Secondary | ICD-10-CM | POA: Diagnosis not present

## 2020-04-03 DIAGNOSIS — R519 Headache, unspecified: Secondary | ICD-10-CM | POA: Diagnosis not present

## 2020-04-03 DIAGNOSIS — Z0000 Encounter for general adult medical examination without abnormal findings: Secondary | ICD-10-CM | POA: Diagnosis not present

## 2020-04-03 DIAGNOSIS — J309 Allergic rhinitis, unspecified: Secondary | ICD-10-CM | POA: Diagnosis not present

## 2020-04-03 DIAGNOSIS — Z720 Tobacco use: Secondary | ICD-10-CM | POA: Diagnosis not present

## 2020-04-03 DIAGNOSIS — Z20822 Contact with and (suspected) exposure to covid-19: Secondary | ICD-10-CM | POA: Diagnosis not present

## 2020-04-03 DIAGNOSIS — J069 Acute upper respiratory infection, unspecified: Secondary | ICD-10-CM | POA: Diagnosis not present

## 2020-07-09 DIAGNOSIS — S60352A Superficial foreign body of left thumb, initial encounter: Secondary | ICD-10-CM | POA: Diagnosis not present

## 2020-08-03 DIAGNOSIS — U071 COVID-19: Secondary | ICD-10-CM | POA: Diagnosis not present

## 2020-09-24 DIAGNOSIS — R42 Dizziness and giddiness: Secondary | ICD-10-CM | POA: Diagnosis not present

## 2020-09-24 DIAGNOSIS — Z6834 Body mass index (BMI) 34.0-34.9, adult: Secondary | ICD-10-CM | POA: Diagnosis not present

## 2020-11-17 ENCOUNTER — Other Ambulatory Visit: Payer: Self-pay | Admitting: Otolaryngology

## 2020-11-17 DIAGNOSIS — R42 Dizziness and giddiness: Secondary | ICD-10-CM

## 2020-11-17 DIAGNOSIS — H53049 Amblyopia suspect, unspecified eye: Secondary | ICD-10-CM | POA: Diagnosis not present

## 2020-12-02 ENCOUNTER — Other Ambulatory Visit: Payer: Self-pay | Admitting: Otolaryngology

## 2020-12-02 ENCOUNTER — Other Ambulatory Visit: Payer: Self-pay

## 2020-12-02 ENCOUNTER — Ambulatory Visit
Admission: RE | Admit: 2020-12-02 | Discharge: 2020-12-02 | Disposition: A | Discharge status: Home / Self Care | Payer: BC Managed Care – PPO | Source: Ambulatory Visit | Attending: Otolaryngology | Admitting: Otolaryngology

## 2020-12-02 DIAGNOSIS — R42 Dizziness and giddiness: Secondary | ICD-10-CM

## 2020-12-02 DIAGNOSIS — H838X9 Other specified diseases of inner ear, unspecified ear: Secondary | ICD-10-CM

## 2020-12-02 MED ORDER — GADOBUTROL 1 MMOL/ML IV SOLN
10.0000 mL | Freq: Once | INTRAVENOUS | Status: AC | PRN
  Administered 2020-12-02: 10 mL via INTRAVENOUS

## 2020-12-22 ENCOUNTER — Other Ambulatory Visit: Payer: Self-pay

## 2020-12-22 ENCOUNTER — Ambulatory Visit
Admission: RE | Admit: 2020-12-22 | Discharge: 2020-12-22 | Disposition: A | Discharge status: Home / Self Care | Payer: BC Managed Care – PPO | Source: Ambulatory Visit | Attending: Otolaryngology | Admitting: Otolaryngology

## 2020-12-22 DIAGNOSIS — H838X9 Other specified diseases of inner ear, unspecified ear: Secondary | ICD-10-CM | POA: Diagnosis not present

## 2020-12-22 DIAGNOSIS — R42 Dizziness and giddiness: Secondary | ICD-10-CM | POA: Diagnosis not present

## 2021-01-29 DIAGNOSIS — R42 Dizziness and giddiness: Secondary | ICD-10-CM | POA: Diagnosis not present

## 2021-01-29 DIAGNOSIS — Z6836 Body mass index (BMI) 36.0-36.9, adult: Secondary | ICD-10-CM | POA: Diagnosis not present

## 2021-07-05 DIAGNOSIS — Z0000 Encounter for general adult medical examination without abnormal findings: Secondary | ICD-10-CM | POA: Diagnosis not present

## 2021-07-05 DIAGNOSIS — E559 Vitamin D deficiency, unspecified: Secondary | ICD-10-CM | POA: Diagnosis not present

## 2021-07-05 DIAGNOSIS — Z113 Encounter for screening for infections with a predominantly sexual mode of transmission: Secondary | ICD-10-CM | POA: Diagnosis not present

## 2021-07-13 DIAGNOSIS — Z720 Tobacco use: Secondary | ICD-10-CM | POA: Diagnosis not present

## 2021-07-13 DIAGNOSIS — J309 Allergic rhinitis, unspecified: Secondary | ICD-10-CM | POA: Diagnosis not present

## 2021-07-13 DIAGNOSIS — E559 Vitamin D deficiency, unspecified: Secondary | ICD-10-CM | POA: Diagnosis not present

## 2021-07-13 DIAGNOSIS — Z1331 Encounter for screening for depression: Secondary | ICD-10-CM | POA: Diagnosis not present

## 2021-07-13 DIAGNOSIS — Z0000 Encounter for general adult medical examination without abnormal findings: Secondary | ICD-10-CM | POA: Diagnosis not present

## 2021-09-06 DIAGNOSIS — R590 Localized enlarged lymph nodes: Secondary | ICD-10-CM | POA: Diagnosis not present

## 2021-09-06 DIAGNOSIS — F17210 Nicotine dependence, cigarettes, uncomplicated: Secondary | ICD-10-CM | POA: Diagnosis not present

## 2021-09-06 DIAGNOSIS — R220 Localized swelling, mass and lump, head: Secondary | ICD-10-CM | POA: Diagnosis not present

## 2021-09-06 DIAGNOSIS — Z79899 Other long term (current) drug therapy: Secondary | ICD-10-CM | POA: Diagnosis not present

## 2021-09-06 DIAGNOSIS — Z7982 Long term (current) use of aspirin: Secondary | ICD-10-CM | POA: Diagnosis not present

## 2021-09-06 DIAGNOSIS — R519 Headache, unspecified: Secondary | ICD-10-CM | POA: Diagnosis not present

## 2021-09-06 DIAGNOSIS — Z6837 Body mass index (BMI) 37.0-37.9, adult: Secondary | ICD-10-CM | POA: Diagnosis not present

## 2022-10-03 ENCOUNTER — Other Ambulatory Visit: Payer: Self-pay | Admitting: Family Medicine

## 2022-10-03 DIAGNOSIS — Z130 Encounter for screening for diseases of the blood and blood-forming organs and certain disorders involving the immune mechanism: Secondary | ICD-10-CM | POA: Diagnosis not present

## 2022-10-03 DIAGNOSIS — Z0000 Encounter for general adult medical examination without abnormal findings: Secondary | ICD-10-CM | POA: Diagnosis not present

## 2022-10-03 DIAGNOSIS — E7800 Pure hypercholesterolemia, unspecified: Secondary | ICD-10-CM | POA: Diagnosis not present

## 2022-10-03 DIAGNOSIS — Z113 Encounter for screening for infections with a predominantly sexual mode of transmission: Secondary | ICD-10-CM | POA: Diagnosis not present

## 2022-10-03 DIAGNOSIS — R7303 Prediabetes: Secondary | ICD-10-CM | POA: Diagnosis not present

## 2022-10-03 DIAGNOSIS — Z720 Tobacco use: Secondary | ICD-10-CM | POA: Diagnosis not present

## 2022-10-03 DIAGNOSIS — E559 Vitamin D deficiency, unspecified: Secondary | ICD-10-CM | POA: Diagnosis not present

## 2022-10-03 DIAGNOSIS — Z1231 Encounter for screening mammogram for malignant neoplasm of breast: Secondary | ICD-10-CM

## 2022-10-03 DIAGNOSIS — Z1331 Encounter for screening for depression: Secondary | ICD-10-CM | POA: Diagnosis not present

## 2022-10-03 DIAGNOSIS — J309 Allergic rhinitis, unspecified: Secondary | ICD-10-CM | POA: Diagnosis not present

## 2023-05-12 IMAGING — CT CT TEMPORAL BONES W/O CM
3 of 6 series · 15 of 40 positions shown, 18 images · non-contrast
Comparison: No pertinent prior exam.

CLINICAL DATA: Vertigo for the past year

EXAM:
CT TEMPORAL BONES WITHOUT CONTRAST
TECHNIQUE: Axial and coronal plane CT imaging of the petrous temporal bones was
performed with thin-collimation image reconstruction. No intravenous
contrast was administered. Multiplanar CT image reconstructions were
also generated.

[Series 3: ax soft temperal bones 2.00 · axial · 0.33mm/px · z∈[-488,-468]mm · 2 of 31 slices shown]
[im 11/31  brain]
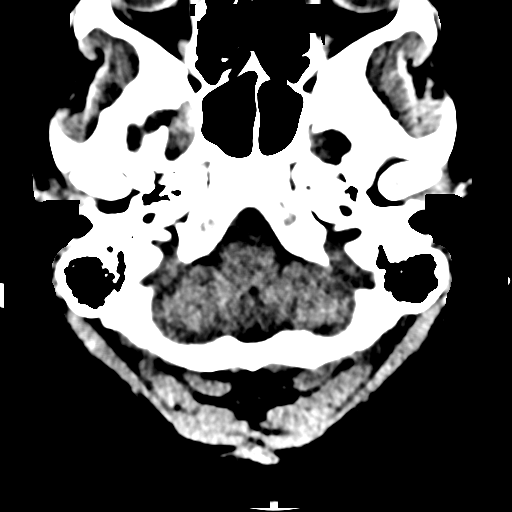
[im 21/31  brain]
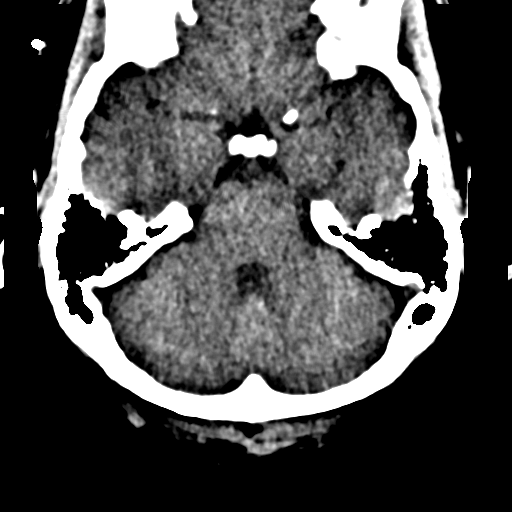

[Series 8: ax mag right temperal bones 0.60 · axial · 0.20mm/px · z∈[-504,-455]mm · 11 of 100 slices shown, 14 images]
[im 9/100  brain]
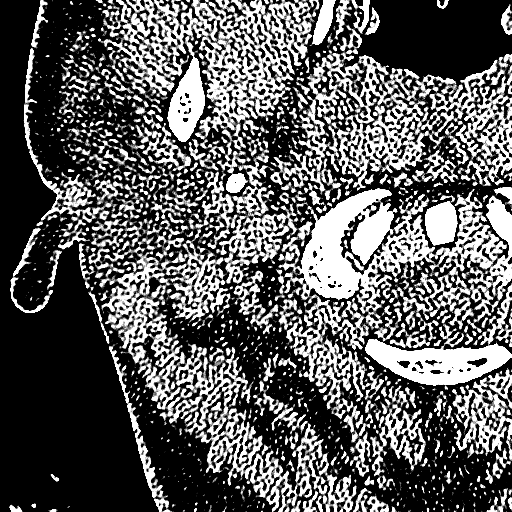
[im 9/100  bone]
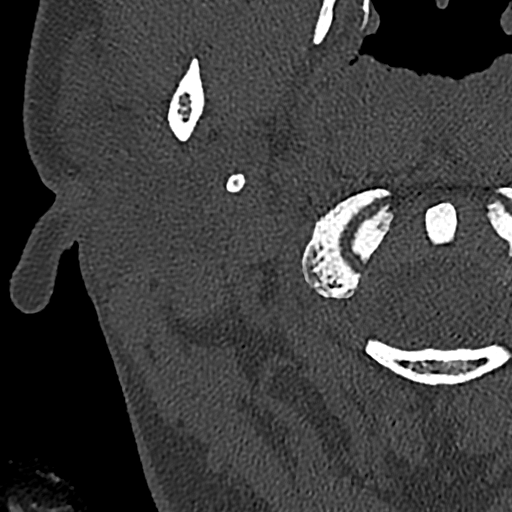
[im 17/100  bone]
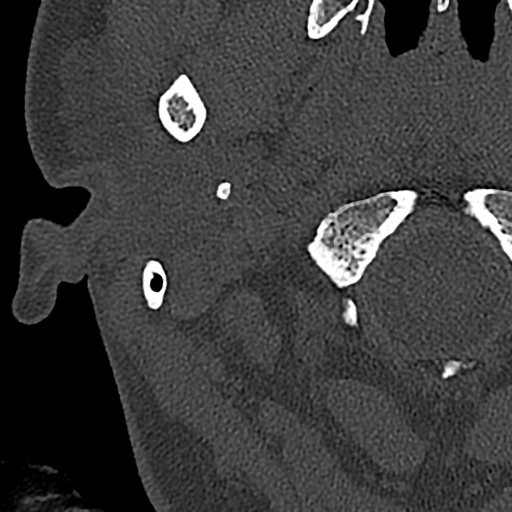
[im 25/100  bone]
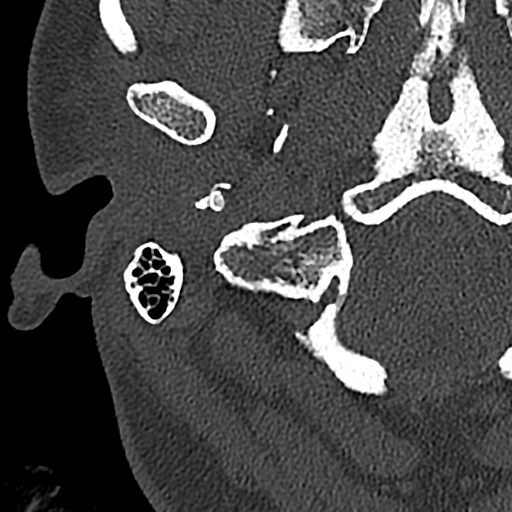
[im 34/100  bone]
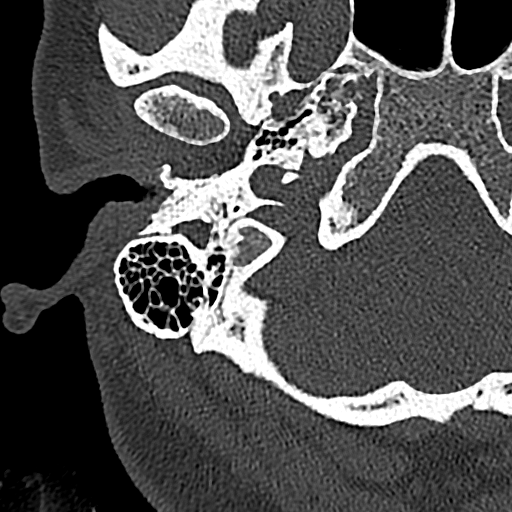
[im 42/100  brain]
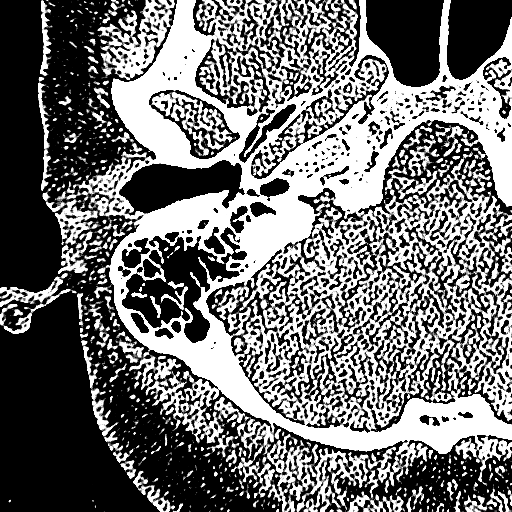
[im 42/100  bone]
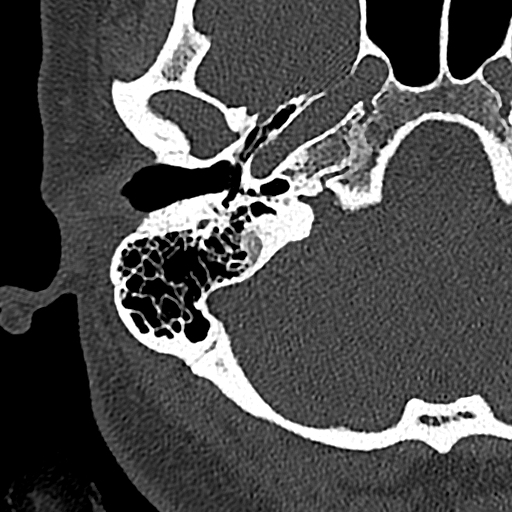
[im 50/100  bone]
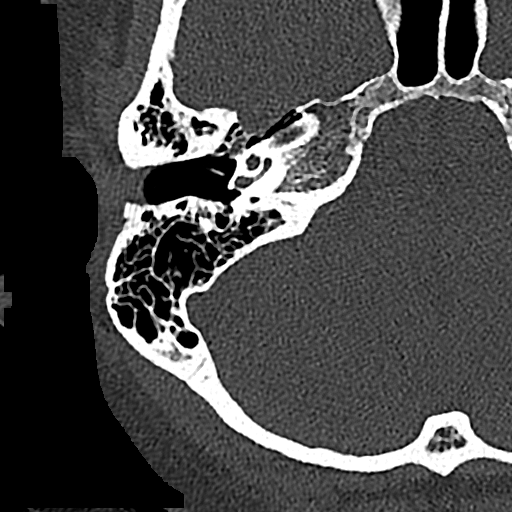
[im 58/100  bone]
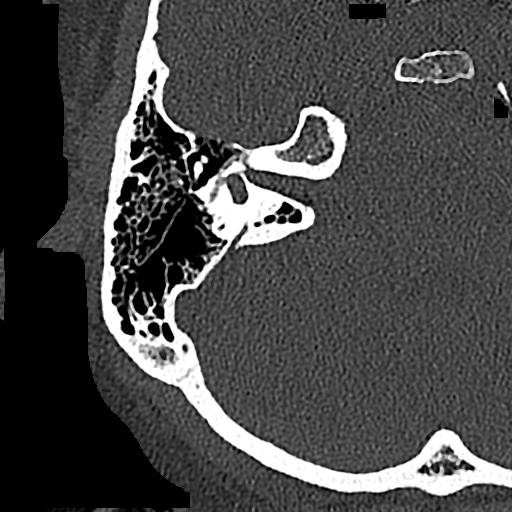
[im 67/100  bone]
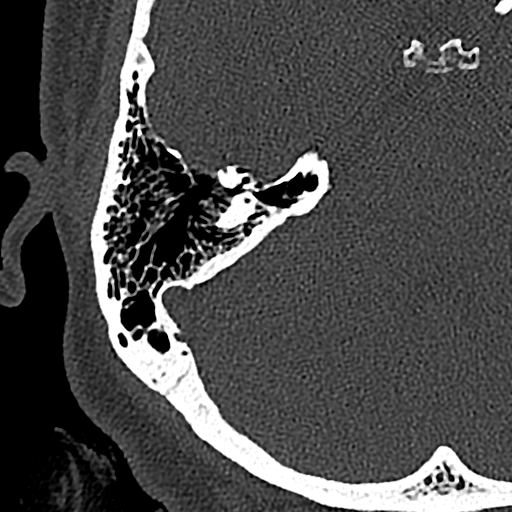
[im 75/100  brain]
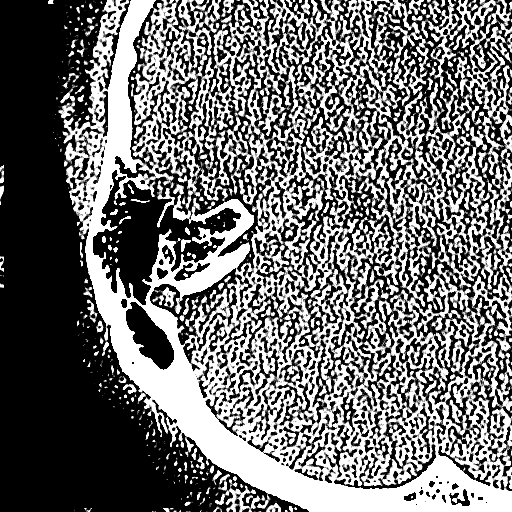
[im 75/100  bone]
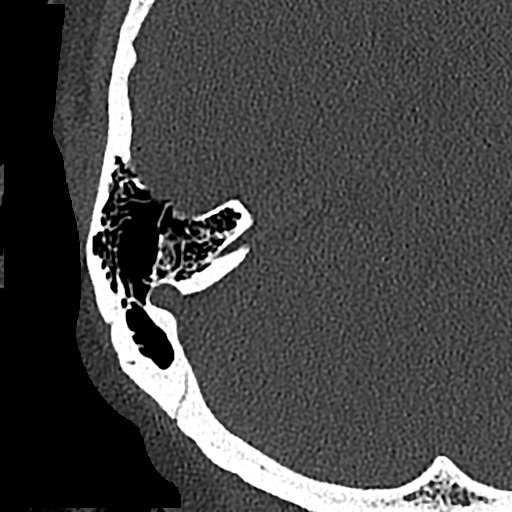
[im 83/100  bone]
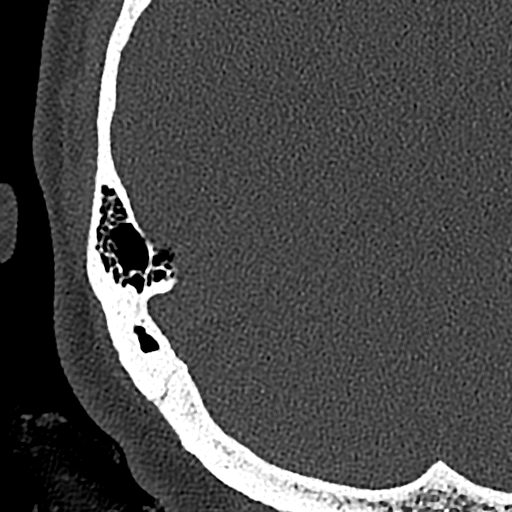
[im 91/100  bone]
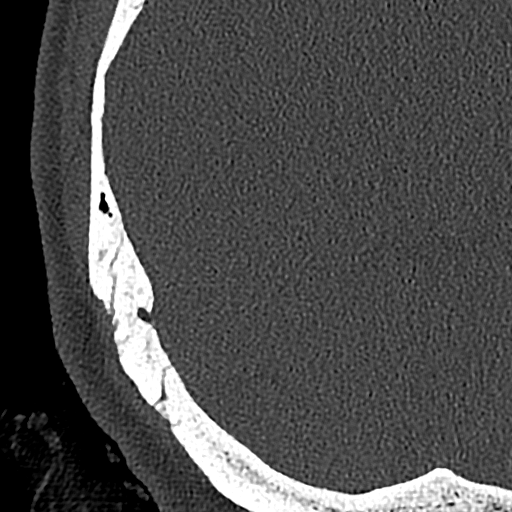

[Series 10: cor mag right temperal bones 0.80 cor · coronal · 0.12mm/px · 2 of 125 slices shown]
[im 42/125  bone]
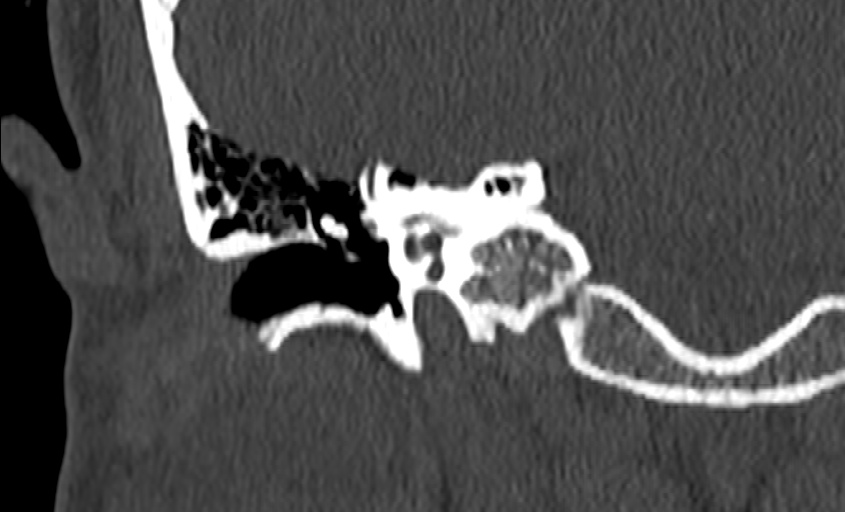
[im 83/125  bone]
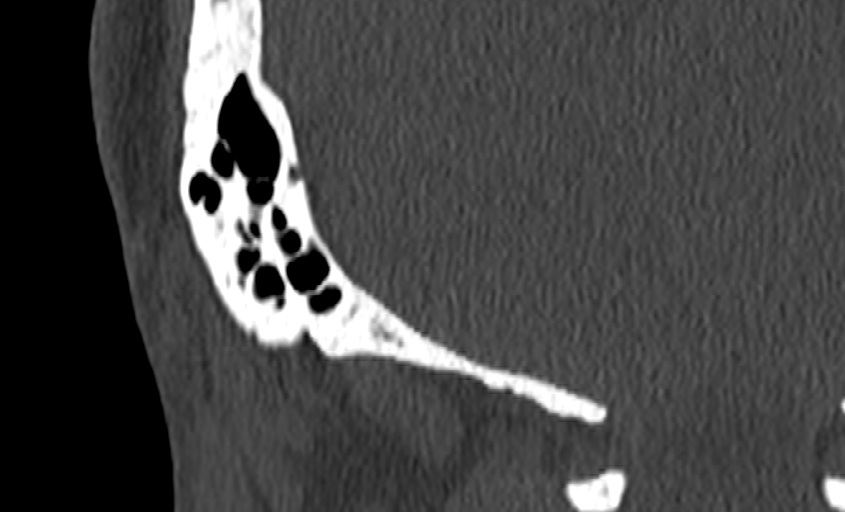

[15 of 40 positions shown; findings below may reference images not displayed]

FINDINGS: RIGHT TEMPORAL BONE

External auditory canal: Normal.

Middle ear cavity: Normally aerated. The scutum and ossicles are
normal. The tegmen tympani is intact.

Inner ear structures: Broad imperceptible bony covering of the
superior semi circular canal.

Internal auditory and facial nerve canals:  Normal

Mastoid air cells: Normally aerated. No osseous erosion.

LEFT TEMPORAL BONE

External auditory canal: Normal.

Middle ear cavity: Normally aerated. The scutum and ossicles are
normal. The tegmen tympani is intact.

Inner ear structures: Broad imperceptible bony covering of the
superior semi circular canal.

Internal auditory and facial nerve canals:  Normal.

Mastoid air cells: Normally aerated. No osseous erosion.

Vascular: Normal non-contrast appearance of the carotid canals,
jugular bulbs and sigmoid plates.

Limited intracranial:  Negative

Visible orbits/paranasal sinuses: Negative

Soft tissues: Negative
IMPRESSION: Imperceptible bony covering of the bilateral superior semi circular
canals, consider third window syndrome.

## 2023-10-03 DIAGNOSIS — R61 Generalized hyperhidrosis: Secondary | ICD-10-CM | POA: Diagnosis not present

## 2023-10-03 DIAGNOSIS — G4700 Insomnia, unspecified: Secondary | ICD-10-CM | POA: Diagnosis not present

## 2023-10-03 DIAGNOSIS — Z113 Encounter for screening for infections with a predominantly sexual mode of transmission: Secondary | ICD-10-CM | POA: Diagnosis not present

## 2023-10-03 DIAGNOSIS — N920 Excessive and frequent menstruation with regular cycle: Secondary | ICD-10-CM | POA: Diagnosis not present

## 2023-10-03 DIAGNOSIS — R232 Flushing: Secondary | ICD-10-CM | POA: Diagnosis not present

## 2023-10-03 DIAGNOSIS — Z01419 Encounter for gynecological examination (general) (routine) without abnormal findings: Secondary | ICD-10-CM | POA: Diagnosis not present

## 2023-10-03 DIAGNOSIS — Z124 Encounter for screening for malignant neoplasm of cervix: Secondary | ICD-10-CM | POA: Diagnosis not present

## 2023-10-11 DIAGNOSIS — D259 Leiomyoma of uterus, unspecified: Secondary | ICD-10-CM | POA: Diagnosis not present

## 2023-10-11 DIAGNOSIS — N852 Hypertrophy of uterus: Secondary | ICD-10-CM | POA: Diagnosis not present

## 2023-10-11 DIAGNOSIS — N949 Unspecified condition associated with female genital organs and menstrual cycle: Secondary | ICD-10-CM | POA: Diagnosis not present

## 2023-10-11 DIAGNOSIS — N920 Excessive and frequent menstruation with regular cycle: Secondary | ICD-10-CM | POA: Diagnosis not present

## 2023-10-30 DIAGNOSIS — M62838 Other muscle spasm: Secondary | ICD-10-CM | POA: Diagnosis not present

## 2023-10-30 DIAGNOSIS — M542 Cervicalgia: Secondary | ICD-10-CM | POA: Diagnosis not present

## 2023-12-06 DIAGNOSIS — Z1231 Encounter for screening mammogram for malignant neoplasm of breast: Secondary | ICD-10-CM | POA: Diagnosis not present

## 2023-12-06 DIAGNOSIS — N83202 Unspecified ovarian cyst, left side: Secondary | ICD-10-CM | POA: Diagnosis not present

## 2024-01-31 DIAGNOSIS — N83202 Unspecified ovarian cyst, left side: Secondary | ICD-10-CM | POA: Diagnosis not present

## 2024-02-14 ENCOUNTER — Other Ambulatory Visit: Payer: Self-pay | Admitting: Nurse Practitioner

## 2024-02-14 DIAGNOSIS — N83292 Other ovarian cyst, left side: Secondary | ICD-10-CM

## 2024-04-02 ENCOUNTER — Other Ambulatory Visit

## 2024-04-25 ENCOUNTER — Other Ambulatory Visit
